# Patient Record
Sex: Male | Born: 1965 | Race: White | Hispanic: No | Marital: Married | Smoking: Never smoker
Health system: Southern US, Community
[De-identification: ages and names within clinical notes are randomized; demographics above are authoritative.]

## PROBLEM LIST (undated history)

## (undated) DIAGNOSIS — E079 Disorder of thyroid, unspecified: Secondary | ICD-10-CM

## (undated) DIAGNOSIS — I10 Essential (primary) hypertension: Secondary | ICD-10-CM

## (undated) DIAGNOSIS — E785 Hyperlipidemia, unspecified: Secondary | ICD-10-CM

## (undated) HISTORY — DX: Hyperlipidemia, unspecified: E78.5

---

## 2014-05-29 ENCOUNTER — Ambulatory Visit
Admit: 2014-05-29 | Disposition: A | Discharge status: Home / Self Care | Payer: Self-pay | Attending: Family Medicine | Admitting: Family Medicine

## 2014-05-29 LAB — DOT URINE DIP
GLUCOSE, UR: NEGATIVE
Protein: NEGATIVE
Specific Gravity: 1.015 (ref 1.000–1.030)

## 2015-05-28 ENCOUNTER — Ambulatory Visit
Admission: EM | Admit: 2015-05-28 | Discharge: 2015-05-28 | Disposition: A | Discharge status: Home / Self Care | Payer: PRIVATE HEALTH INSURANCE | Attending: Family Medicine | Admitting: Family Medicine

## 2015-05-28 ENCOUNTER — Encounter: Payer: Self-pay | Admitting: *Deleted

## 2015-05-28 DIAGNOSIS — Z0289 Encounter for other administrative examinations: Secondary | ICD-10-CM

## 2015-05-28 HISTORY — DX: Essential (primary) hypertension: I10

## 2015-05-28 HISTORY — DX: Disorder of thyroid, unspecified: E07.9

## 2015-05-28 LAB — DEPT OF TRANSP DIPSTICK, URINE (ARMC ONLY)
Glucose, UA: NEGATIVE mg/dL
Protein, ur: NEGATIVE mg/dL
Specific Gravity, Urine: 1.025 (ref 1.005–1.030)

## 2015-05-28 NOTE — ED Provider Notes (Signed)
CSN: [id number]     Arrival date & time 05/28/15  1307 History   None    Chief Complaint  Patient presents with  . Commercial Driver's License Exam   (Consider location/radiation/quality/duration/timing/severity/associated sxs/prior Treatment) HPI Comments: Patient here for DOT Physical (see scanned form)   The history is provided by the patient.    Past Medical History  Diagnosis Date  . Hypertension   . Thyroid disease    History reviewed. No pertinent past surgical history. History reviewed. No pertinent family history. Social History  Substance Use Topics  . Smoking status: Never Smoker   . Smokeless tobacco: Never Used  . Alcohol Use: Yes    Review of Systems  Allergies  Review of patient's allergies indicates no known allergies.  Home Medications   Prior to Admission medications   Medication Sig Start Date End Date Taking? Authorizing Provider  levothyroxine (SYNTHROID, LEVOTHROID) 150 MCG tablet Take 150 mcg by mouth daily before breakfast.   Yes Historical Provider, MD   Meds Ordered and Administered this Visit  Medications - No data to display  BP 141/89 mmHg  Pulse 69  Temp(Src) 98.4 F (36.9 C)  Resp 18  Ht 6\' 7"  (2.007 m)  Wt 234 lb (106.142 kg)  BMI 26.35 kg/m2  SpO2 98% No data found.   Physical Exam  ED Course  Procedures (including critical care time)  Labs Review Labs Reviewed  DEPT OF TRANSP DIPSTICK, URINE([facility] ONLY) - Abnormal; Notable for the following:    Hgb urine dipstick 1+ (*)    All other components within normal limits    Imaging Review No results found.   Visual Acuity Review  Right Eye Distance:   Left Eye Distance:   Bilateral Distance:    Right Eye Near:   Left Eye Near:    Bilateral Near:         MDM   1. Encounter for examination required by Department of Transportation (DOT)    DOT Physical (medically qualified for 1 year; see scanned form)  Payton Mccallumrlando Mauriah Mcmillen, MD 05/28/15 1438

## 2015-05-28 NOTE — ED Notes (Signed)
Patient is here for DOT/CDL physical.

## 2016-05-26 ENCOUNTER — Ambulatory Visit
Admission: EM | Admit: 2016-05-26 | Discharge: 2016-05-26 | Disposition: A | Discharge status: Home / Self Care | Payer: PRIVATE HEALTH INSURANCE | Attending: Family Medicine | Admitting: Family Medicine

## 2016-05-26 ENCOUNTER — Encounter: Payer: Self-pay | Admitting: *Deleted

## 2016-05-26 DIAGNOSIS — Z024 Encounter for examination for driving license: Secondary | ICD-10-CM

## 2016-05-26 LAB — DEPT OF TRANSP DIPSTICK, URINE (ARMC ONLY)
Glucose, UA: NEGATIVE mg/dL
PROTEIN: NEGATIVE mg/dL
Specific Gravity, Urine: <1.005 — ABNORMAL LOW (ref 1.005–1.030)

## 2016-05-26 NOTE — ED Provider Notes (Signed)
[facility]    CSN: [id number] Arrival date & time: 05/26/16  1428     History   Chief Complaint Chief Complaint  Patient presents with  . Commercial Driver's License Exam    HPI Bill Barber is a 51 y.o. male.   Patient's here for DOT patient had a history of hypothyroidism he had a history hypertension but about 3 years ago he lost 40 pounds and was taken off his blood pressure medicine. He states that they've been giving him 1 year DOT's and he requests 2 years this time. Since is no longer on blood pressure medicine will give him a 2 year DOT since it's been 3 years.   The history is provided by the patient. No language interpreter was used.    Past Medical History:  Diagnosis Date  . Hypertension   . Thyroid disease     There are no active problems to display for this patient.   History reviewed. No pertinent surgical history.     Home Medications    Prior to Admission medications   Medication Sig Start Date End Date Taking? Authorizing Provider  levothyroxine (SYNTHROID, LEVOTHROID) 150 MCG tablet Take 150 mcg by mouth daily before breakfast.   Yes Historical Provider, MD    Family History History reviewed. No pertinent family history.  Social History Social History  Substance Use Topics  . Smoking status: Never Smoker  . Smokeless tobacco: Never Used  . Alcohol use Yes     Allergies   Patient has no known allergies.   Review of Systems Review of Systems  All other systems reviewed and are negative.    Physical Exam Triage Vital Signs ED Triage Vitals  Enc Vitals Group     BP 05/26/16 1553 138/88     Pulse Rate 05/26/16 1553 83     Resp 05/26/16 1553 16     Temp 05/26/16 1553 98.5 F (36.9 C)     Temp Source 05/26/16 1553 Oral     SpO2 05/26/16 1553 98 %     Weight 05/26/16 1603 252 lb (114.3 kg)     Height 05/26/16 1603  (1.778 m)     Head Circumference --      Peak Flow --      Pain Score --      Pain  Loc --      Pain Edu? --      Excl. in GC? --    No data found.   Updated Vital Signs BP 138/88 (BP Location: Left Arm)   Pulse 83   Temp 98.5 F (36.9 C) (Oral)   Resp 16   Ht  (1.778 m)   Wt 252 lb (114.3 kg)   SpO2 98%   BMI 36.16 kg/m   Visual Acuity Right Eye Distance:   Left Eye Distance:   Bilateral Distance:    Right Eye Near:   Left Eye Near:    Bilateral Near:     Physical Exam  Constitutional: He is oriented to person, place, and time. He appears well-developed and well-nourished.  HENT:  Head: Normocephalic and atraumatic.  Eyes: EOM are normal. Pupils are equal, round, and reactive to light.  Neck: Normal range of motion. Neck supple.  Cardiovascular: Normal rate.   Pulmonary/Chest: Effort normal.  Abdominal: Soft. Bowel sounds are normal. He exhibits no distension. There is no tenderness. There is no guarding.  Genitourinary: Penis normal.  Musculoskeletal: Normal range of motion. He exhibits no  edema or deformity.  Neurological: He is alert and oriented to person, place, and time.  Skin: Skin is warm.  Psychiatric: He has a normal mood and affect.     UC Treatments / Results  Labs (all labs ordered are listed, but only abnormal results are displayed) Labs Reviewed  DEPT OF TRANSP DIPSTICK, URINE([facility] ONLY) - Abnormal; Notable for the following:       Result Value   Specific Gravity, Urine <1.005 (*)    Hgb urine dipstick SMALL (*)    All other components within normal limits    EKG  EKG Interpretation None       Radiology No results found.  Procedures Procedures (including critical care time)  Medications Ordered in UC Medications - No data to display   Initial Impression / Assessment and Plan / UC Course  I have reviewed the triage vital signs and the nursing notes.  Pertinent labs & imaging results that were available during my care of the patient were reviewed by me and considered in my medical decision  making (see chart for details).   patient's here for DOT given 2 years DOT    Final Clinical Impressions(s) / UC Diagnoses   Final diagnoses:  Encounter for commercial driver medical examination (CDME)    New Prescriptions New Prescriptions   No medications on file     Note: This dictation was prepared with Dragon dictation along with smaller phrase technology. Any transcriptional errors that result from this process are unintentional.   Hassan Rowan, MD 05/26/16 [phone]

## 2016-05-26 NOTE — ED Triage Notes (Signed)
DOT physical. 

## 2018-05-19 ENCOUNTER — Other Ambulatory Visit: Payer: Self-pay

## 2018-05-19 ENCOUNTER — Other Ambulatory Visit: Payer: Self-pay | Admitting: Family Medicine

## 2018-05-19 ENCOUNTER — Ambulatory Visit
Admission: RE | Admit: 2018-05-19 | Discharge: 2018-05-19 | Disposition: A | Discharge status: Home / Self Care | Payer: Managed Care, Other (non HMO) | Attending: Family Medicine | Admitting: Family Medicine

## 2018-05-19 ENCOUNTER — Ambulatory Visit
Admission: RE | Admit: 2018-05-19 | Discharge: 2018-05-19 | Disposition: A | Discharge status: Home / Self Care | Payer: Managed Care, Other (non HMO) | Source: Ambulatory Visit | Attending: Family Medicine | Admitting: Family Medicine

## 2018-05-19 DIAGNOSIS — M25561 Pain in right knee: Secondary | ICD-10-CM

## 2019-04-16 IMAGING — CR RIGHT KNEE - COMPLETE 4+ VIEW
4 series · 4 of 4 positions shown · non-contrast
Comparison: None

CLINICAL DATA: Blunt trauma to RIGHT medial femoral condyle today,
pain, abrasion

EXAM:
RIGHT KNEE - COMPLETE 4+ VIEW

[knee ap]
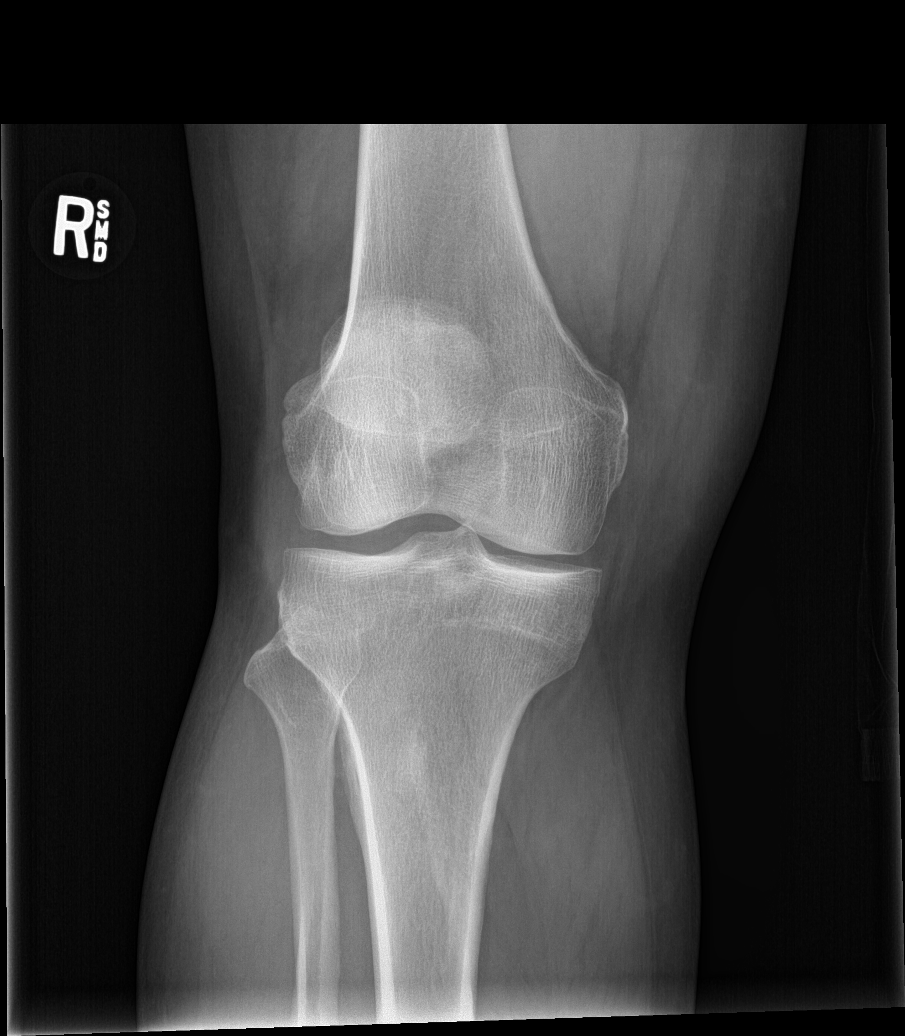

[knee lat]
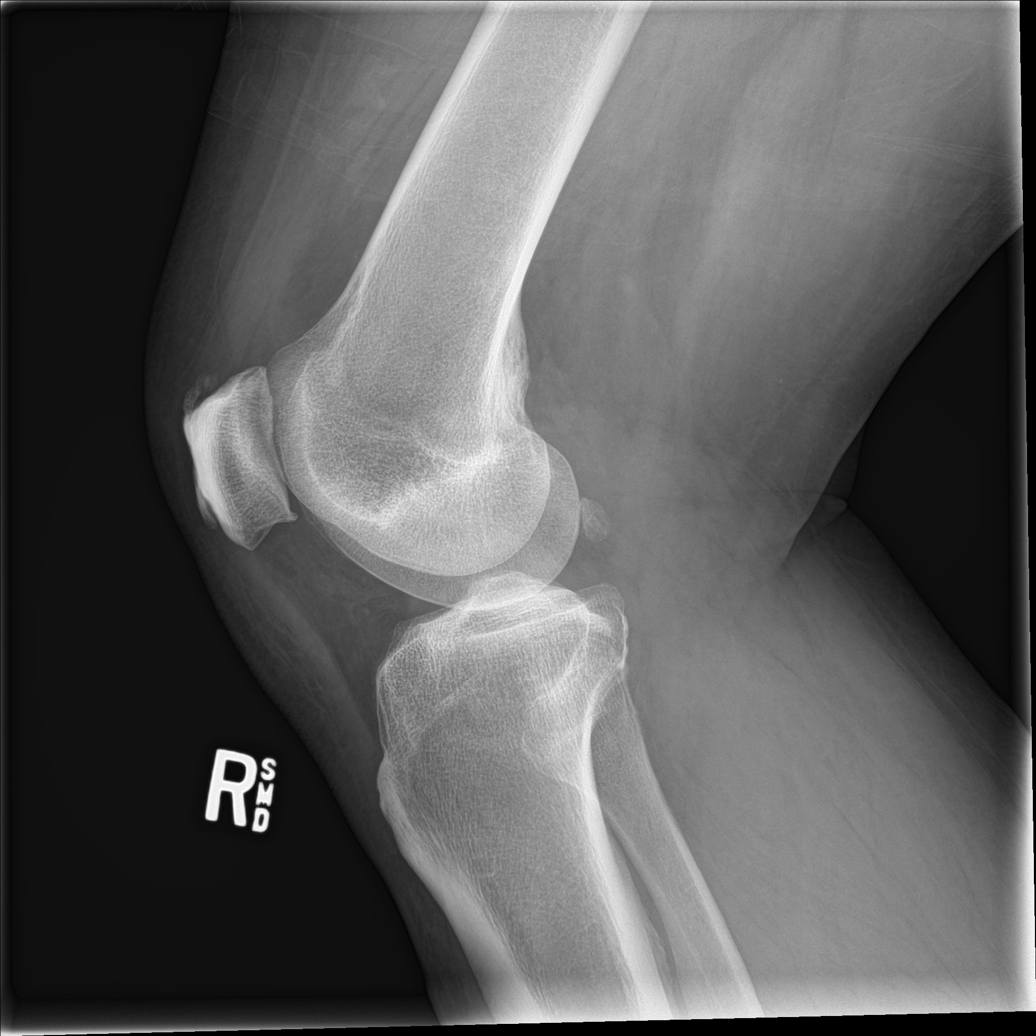

[knee obl (1 of 2)]
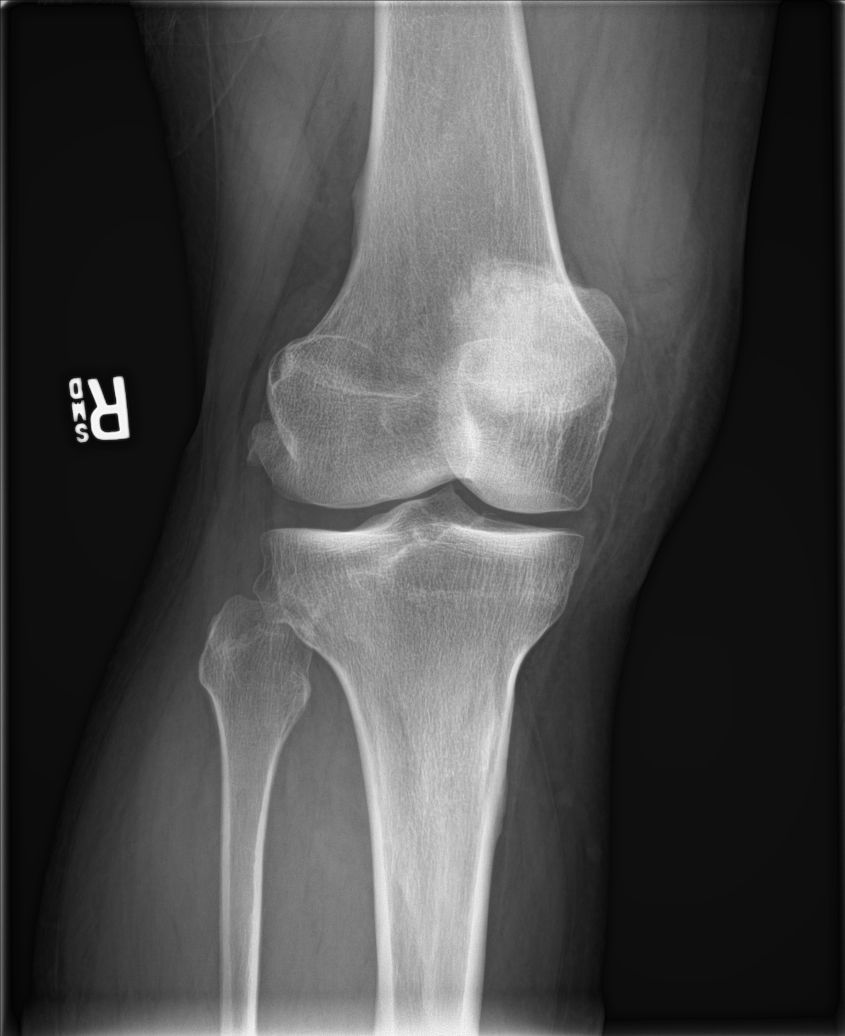

[knee obl (2 of 2)]
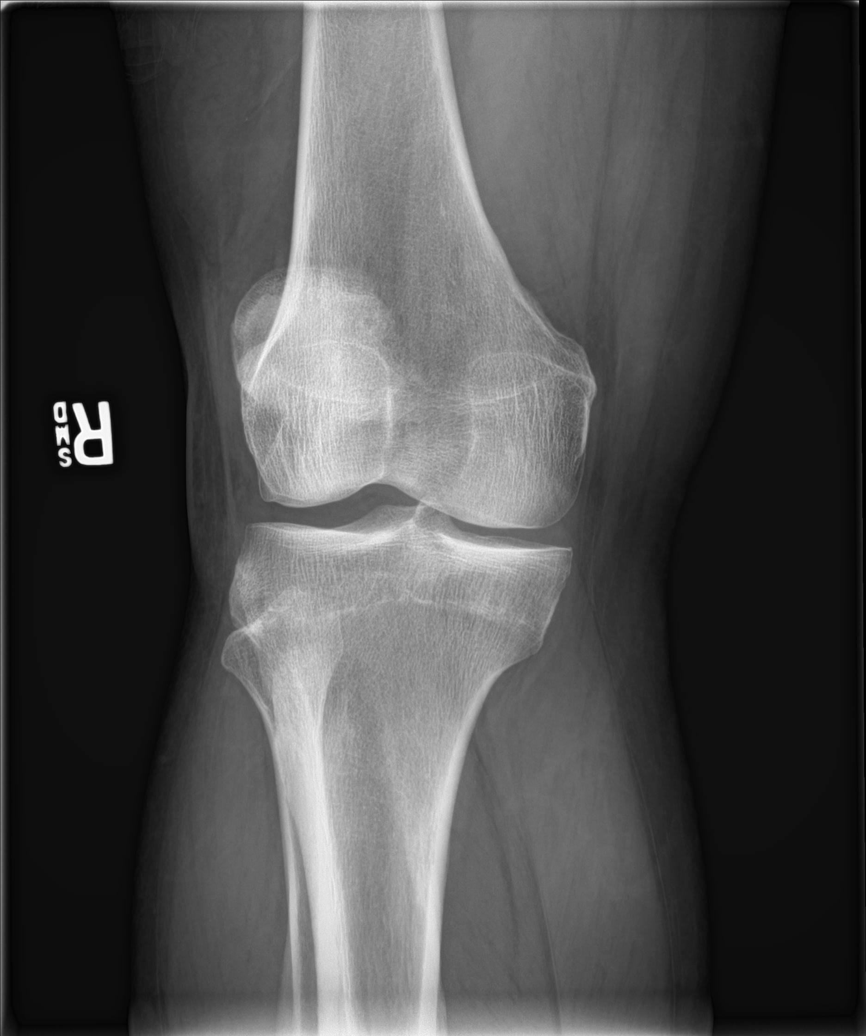

[4 of 4 positions shown; findings below may reference images not displayed]

FINDINGS: Osseous mineralization low normal.

Patellofemoral joint space narrowing and small inferior patellar
marginal spur.

Joint spaces otherwise preserved.

No acute fracture, dislocation, or bone destruction.

Medial soft tissue swelling.

No knee joint effusion.
IMPRESSION: Mild degenerative changes RIGHT patellofemoral joint.

Medial soft tissue swelling RIGHT knee without acute osseous
abnormalities.

## 2022-02-04 ENCOUNTER — Other Ambulatory Visit: Payer: Self-pay | Admitting: Physician Assistant

## 2022-02-04 ENCOUNTER — Ambulatory Visit
Admission: RE | Admit: 2022-02-04 | Discharge: 2022-02-04 | Disposition: A | Discharge status: Home / Self Care | Payer: Worker's Compensation | Source: Ambulatory Visit | Attending: Physician Assistant | Admitting: Physician Assistant

## 2022-02-04 DIAGNOSIS — S8991XA Unspecified injury of right lower leg, initial encounter: Secondary | ICD-10-CM

## 2022-08-05 ENCOUNTER — Encounter: Payer: Self-pay | Admitting: Cardiovascular Disease

## 2022-08-05 ENCOUNTER — Ambulatory Visit (INDEPENDENT_AMBULATORY_CARE_PROVIDER_SITE_OTHER): Payer: No Typology Code available for payment source | Admitting: Cardiovascular Disease

## 2022-08-05 VITALS — BP 146/80 | HR 85 | Ht 69.0 in | Wt 248.0 lb

## 2022-08-05 DIAGNOSIS — E782 Mixed hyperlipidemia: Secondary | ICD-10-CM

## 2022-08-05 DIAGNOSIS — I10 Essential (primary) hypertension: Secondary | ICD-10-CM | POA: Diagnosis not present

## 2022-08-05 DIAGNOSIS — Z6836 Body mass index (BMI) 36.0-36.9, adult: Secondary | ICD-10-CM | POA: Insufficient documentation

## 2022-08-05 DIAGNOSIS — I499 Cardiac arrhythmia, unspecified: Secondary | ICD-10-CM

## 2022-08-05 DIAGNOSIS — E039 Hypothyroidism, unspecified: Secondary | ICD-10-CM | POA: Insufficient documentation

## 2022-08-05 NOTE — Assessment & Plan Note (Signed)
Better on recheck. Patient states he is working on diet and exercise to lose weight. DASH handout given.

## 2022-08-05 NOTE — Assessment & Plan Note (Signed)
Patient reports episodes of atrial fibrillation per smart watch. No strips available. Will order a holter monitor to check for arrhythmias. Echo to check heart function. Stress test to check for blockages.

## 2022-08-05 NOTE — Assessment & Plan Note (Signed)
LDL 12/2021 68, continue atorvastatin 40 mg daily.

## 2022-08-05 NOTE — Assessment & Plan Note (Signed)
The patient is asked to make an attempt to improve diet and exercise patterns to aid in medical management of this problem.  

## 2022-08-05 NOTE — Progress Notes (Signed)
Cardiology Office Note   Date:  08/05/2022   ID:  Bill Barber, DOB 13-Oct-1965, MRN [id number]  PCP:  [name], MD  Cardiologist:  [name], MD      History of Present Illness: Bill Barber is a 57 y.o. male who presents for  Chief Complaint  Patient presents with   New Patient (Initial Visit)    Consult    Patient in office to establish care. Reports 4 episodes of possible atrial fibrillation over the past year, noted on apple watch. Denies chest pain, shortness of breath, dizziness.     Past Medical History:  Diagnosis Date   Hyperlipidemia    Hypertension    Thyroid disease      History reviewed. No pertinent surgical history.   Current Outpatient Medications  Medication Sig Dispense Refill   amLODipine (NORVASC) 10 MG tablet Take 10 mg by mouth daily.     atorvastatin (LIPITOR) 40 MG tablet Take 40 mg by mouth daily.     Cholecalciferol (D 1000) 25 MCG (1000 UT) capsule Take 1,000 Units by mouth daily.     hydrochlorothiazide (HYDRODIURIL) 25 MG tablet Take 25 mg by mouth daily.     levothyroxine (SYNTHROID, LEVOTHROID) 150 MCG tablet Take 150 mcg by mouth daily before breakfast.     lisinopril (ZESTRIL) 40 MG tablet Take 40 mg by mouth daily.     sildenafil (VIAGRA) 100 MG tablet Take 100 mg by mouth daily as needed.     Turmeric (QC TUMERIC COMPLEX) 500 MG CAPS Take by mouth.     Zinc Picolinate POWD Take by mouth.     No current facility-administered medications for this visit.    Allergies:   Patient has no known allergies.    Social History:   reports that he has never smoked. He has never used smokeless tobacco. He reports current alcohol use. He reports that he does not use drugs.   Family History:  family history is not on file.    ROS:     Review of Systems  Constitutional: Negative.   HENT: Negative.    Eyes: Negative.   Respiratory: Negative.    Cardiovascular: Negative.   Gastrointestinal: Negative.    Genitourinary: Negative.   Musculoskeletal: Negative.   Skin: Negative.   Neurological: Negative.   Endo/Heme/Allergies: Negative.   Psychiatric/Behavioral: Negative.    All other systems reviewed and are negative.    All other systems are reviewed and negative.    PHYSICAL EXAM: VS:  BP (!) 146/80 (BP Location: Left Arm, Patient Position: Sitting, Cuff Size: Large)   Pulse 85   Ht 5\' 9"  (1.753 m)   Wt 248 lb (112.5 kg)   SpO2 98%   BMI 36.62 kg/m  , BMI Body mass index is 36.62 kg/m. Last weight:  Wt Readings from Last 3 Encounters:  08/05/22 248 lb (112.5 kg)  05/26/16 252 lb (114.3 kg)  05/28/15 234 lb (106.1 kg)     Physical Exam Vitals reviewed.  Constitutional:      Appearance: Normal appearance. He is normal weight.  HENT:     Head: Normocephalic.     Nose: Nose normal.     Mouth/Throat:     Mouth: Mucous membranes are moist.  Eyes:     Pupils: Pupils are equal, round, and reactive to light.  Cardiovascular:     Rate and Rhythm: Normal rate and regular rhythm.     Pulses: Normal pulses.     Heart  sounds: Normal heart sounds.  Pulmonary:     Effort: Pulmonary effort is normal.  Abdominal:     General: Abdomen is flat. Bowel sounds are normal.  Musculoskeletal:        General: Normal range of motion.     Cervical back: Normal range of motion.  Skin:    General: Skin is warm.  Neurological:     General: No focal deficit present.     Mental Status: He is alert.  Psychiatric:        Mood and Affect: Mood normal.     EKG: normal sinus rhythm, HR 87 bpm  Recent Labs: No results found for requested labs within last 365 days.    Lipid Panel No results found for: "CHOL", "TRIG", "HDL", "CHOLHDL", "VLDL", "LDLCALC", "LDLDIRECT"    ASSESSMENT AND PLAN:    ICD-10-CM   1. Cardiac arrhythmia, unspecified cardiac arrhythmia type  I49.9 PCV ECHOCARDIOGRAM COMPLETE    MYOCARDIAL PERFUSION IMAGING    Holter monitor - 72 hour    2. Mixed  hyperlipidemia  E78.2     3. Primary hypertension  I10 PCV ECHOCARDIOGRAM COMPLETE    MYOCARDIAL PERFUSION IMAGING    4. BMI 36.0-36.9,adult  Z68.36        Problem List Items Addressed This Visit       Cardiovascular and Mediastinum   Primary hypertension    Better on recheck. Patient states he is working on diet and exercise to lose weight. DASH handout given.       Relevant Medications   amLODipine (NORVASC) 10 MG tablet   atorvastatin (LIPITOR) 40 MG tablet   hydrochlorothiazide (HYDRODIURIL) 25 MG tablet   lisinopril (ZESTRIL) 40 MG tablet   sildenafil (VIAGRA) 100 MG tablet   Other Relevant Orders   PCV ECHOCARDIOGRAM COMPLETE   MYOCARDIAL PERFUSION IMAGING   Cardiac arrhythmia - Primary    Patient reports episodes of atrial fibrillation per smart watch. No strips available. Will order a holter monitor to check for arrhythmias. Echo to check heart function. Stress test to check for blockages.       Relevant Medications   amLODipine (NORVASC) 10 MG tablet   atorvastatin (LIPITOR) 40 MG tablet   hydrochlorothiazide (HYDRODIURIL) 25 MG tablet   lisinopril (ZESTRIL) 40 MG tablet   sildenafil (VIAGRA) 100 MG tablet   Other Relevant Orders   PCV ECHOCARDIOGRAM COMPLETE   MYOCARDIAL PERFUSION IMAGING   Holter monitor - 72 hour     Other   Mixed hyperlipidemia    LDL 12/2021 68, continue atorvastatin 40 mg daily.       Relevant Medications   amLODipine (NORVASC) 10 MG tablet   atorvastatin (LIPITOR) 40 MG tablet   hydrochlorothiazide (HYDRODIURIL) 25 MG tablet   lisinopril (ZESTRIL) 40 MG tablet   sildenafil (VIAGRA) 100 MG tablet   BMI 36.0-36.9,adult    The patient is asked to make an attempt to improve diet and exercise patterns to aid in medical management of this problem.         Disposition:   Return in about 6 weeks (around 09/16/2022) for after 4 week holter.    Total time spent: 30 minutes  Signed,  [name], MD  08/05/2022 11:35 AM     [facility]

## 2022-08-09 ENCOUNTER — Encounter: Payer: Self-pay | Admitting: Cardiovascular Disease

## 2022-08-18 ENCOUNTER — Ambulatory Visit (INDEPENDENT_AMBULATORY_CARE_PROVIDER_SITE_OTHER): Payer: No Typology Code available for payment source

## 2022-08-18 DIAGNOSIS — I499 Cardiac arrhythmia, unspecified: Secondary | ICD-10-CM | POA: Diagnosis not present

## 2022-08-18 DIAGNOSIS — I10 Essential (primary) hypertension: Secondary | ICD-10-CM

## 2022-08-18 MED ORDER — TECHNETIUM TC 99M SESTAMIBI GENERIC - CARDIOLITE
32.9000 | Freq: Once | INTRAVENOUS | Status: AC | PRN
Start: 2022-08-18 — End: 2022-08-18
  Administered 2022-08-18: 32.9 via INTRAVENOUS

## 2022-08-18 MED ORDER — TECHNETIUM TC 99M SESTAMIBI GENERIC - CARDIOLITE
10.3000 | Freq: Once | INTRAVENOUS | Status: AC | PRN
  Administered 2022-08-18: 10.3 via INTRAVENOUS

## 2022-08-20 ENCOUNTER — Ambulatory Visit (INDEPENDENT_AMBULATORY_CARE_PROVIDER_SITE_OTHER): Payer: No Typology Code available for payment source

## 2022-08-20 ENCOUNTER — Ambulatory Visit: Payer: No Typology Code available for payment source

## 2022-08-20 DIAGNOSIS — I499 Cardiac arrhythmia, unspecified: Secondary | ICD-10-CM | POA: Diagnosis not present

## 2022-08-20 DIAGNOSIS — I340 Nonrheumatic mitral (valve) insufficiency: Secondary | ICD-10-CM | POA: Diagnosis not present

## 2022-08-20 DIAGNOSIS — I10 Essential (primary) hypertension: Secondary | ICD-10-CM

## 2022-08-24 ENCOUNTER — Encounter: Payer: Self-pay | Admitting: Cardiovascular Disease

## 2022-08-24 ENCOUNTER — Ambulatory Visit (INDEPENDENT_AMBULATORY_CARE_PROVIDER_SITE_OTHER): Payer: No Typology Code available for payment source | Admitting: Cardiovascular Disease

## 2022-08-24 ENCOUNTER — Ambulatory Visit: Payer: No Typology Code available for payment source | Admitting: Cardiovascular Disease

## 2022-08-24 VITALS — BP 186/96 | HR 85 | Ht 69.0 in | Wt 250.8 lb

## 2022-08-24 DIAGNOSIS — I10 Essential (primary) hypertension: Secondary | ICD-10-CM

## 2022-08-24 DIAGNOSIS — R0602 Shortness of breath: Secondary | ICD-10-CM

## 2022-08-24 DIAGNOSIS — I499 Cardiac arrhythmia, unspecified: Secondary | ICD-10-CM | POA: Diagnosis not present

## 2022-08-24 DIAGNOSIS — I340 Nonrheumatic mitral (valve) insufficiency: Secondary | ICD-10-CM

## 2022-08-24 DIAGNOSIS — E782 Mixed hyperlipidemia: Secondary | ICD-10-CM | POA: Diagnosis not present

## 2022-08-24 MED ORDER — SPIRONOLACTONE 25 MG PO TABS
25.0000 mg | ORAL_TABLET | Freq: Every day | ORAL | 11 refills | Status: AC
Start: 2022-08-24 — End: 2023-08-24

## 2022-08-24 NOTE — Progress Notes (Signed)
Cardiology Office Note   Date:  08/24/2022   ID:  Bill Barber, DOB [birth year], MRN [id number]  PCP:  [name], MD  Cardiologist:  [name], MD      History of Present Illness: Bill Barber is a 57 y.o. male who presents for  Chief Complaint  Patient presents with   Follow-up    NST & Echo results    Still has SOB, less palpitation, BP is up      Past Medical History:  Diagnosis Date   Hyperlipidemia    Hypertension    Thyroid disease      History reviewed. No pertinent surgical history.   Current Outpatient Medications  Medication Sig Dispense Refill   amLODipine (NORVASC) 10 MG tablet Take 10 mg by mouth daily.     atorvastatin (LIPITOR) 40 MG tablet Take 40 mg by mouth daily.     Cholecalciferol (D 1000) 25 MCG (1000 UT) capsule Take 1,000 Units by mouth daily.     hydrochlorothiazide (HYDRODIURIL) 25 MG tablet Take 25 mg by mouth daily.     levothyroxine (SYNTHROID, LEVOTHROID) 150 MCG tablet Take 150 mcg by mouth daily before breakfast.     lisinopril (ZESTRIL) 40 MG tablet Take 40 mg by mouth daily.     sildenafil (VIAGRA) 100 MG tablet Take 100 mg by mouth daily as needed.     spironolactone (ALDACTONE) 25 MG tablet Take 1 tablet (25 mg total) by mouth daily. 30 tablet 11   Turmeric (QC TUMERIC COMPLEX) 500 MG CAPS Take by mouth.     Zinc Picolinate POWD Take by mouth.     No current facility-administered medications for this visit.    Allergies:   Patient has no known allergies.    Social History:   reports that he has never smoked. He has never used smokeless tobacco. He reports current alcohol use. He reports that he does not use drugs.   Family History:  family history is not on file.    ROS:     Review of Systems  Constitutional: Negative.   HENT: Negative.    Eyes: Negative.   Respiratory: Negative.    Gastrointestinal: Negative.   Genitourinary: Negative.   Musculoskeletal: Negative.   Skin: Negative.    Neurological: Negative.   Endo/Heme/Allergies: Negative.   Psychiatric/Behavioral: Negative.    All other systems reviewed and are negative.     All other systems are reviewed and negative.    PHYSICAL EXAM: VS:  BP (!) 186/96   Pulse 85   Ht 5\' 9"  (1.753 m)   Wt 250 lb 12.8 oz (113.8 kg)   SpO2 100%   BMI 37.04 kg/m  , BMI Body mass index is 37.04 kg/m. Last weight:  Wt Readings from Last 3 Encounters:  08/24/22 250 lb 12.8 oz (113.8 kg)  08/05/22 248 lb (112.5 kg)  05/26/16 252 lb (114.3 kg)     Physical Exam Vitals reviewed.  Constitutional:      Appearance: Normal appearance. He is normal weight.  HENT:     Head: Normocephalic.     Nose: Nose normal.     Mouth/Throat:     Mouth: Mucous membranes are moist.  Eyes:     Pupils: Pupils are equal, round, and reactive to light.  Cardiovascular:     Rate and Rhythm: Normal rate and regular rhythm.     Pulses: Normal pulses.     Heart sounds: Normal heart sounds.  Pulmonary:  Effort: Pulmonary effort is normal.  Abdominal:     General: Abdomen is flat. Bowel sounds are normal.  Musculoskeletal:        General: Normal range of motion.     Cervical back: Normal range of motion.  Skin:    General: Skin is warm.  Neurological:     General: No focal deficit present.     Mental Status: He is alert.  Psychiatric:        Mood and Affect: Mood normal.       EKG:   Recent Labs: No results found for requested labs within last 365 days.    Lipid Panel No results found for: "CHOL", "TRIG", "HDL", "CHOLHDL", "VLDL", "LDLCALC", "LDLDIRECT"    Other studies Reviewed: Additional studies/ records that were reviewed today include:  Review of the above records demonstrates:       No data to display            ASSESSMENT AND PLAN:    ICD-10-CM   1. Cardiac arrhythmia, unspecified cardiac arrhythmia type  I49.9 spironolactone (ALDACTONE) 25 MG tablet    Basic metabolic panel   Still wearing holter     2. Primary hypertension  I10 spironolactone (ALDACTONE) 25 MG tablet    Basic metabolic panel   add aldactone 25 as BP 180/90, may need hydralazine also but on f/u, check labs 1-2 week    3. Mixed hyperlipidemia  E78.2 spironolactone (ALDACTONE) 25 MG tablet    Basic metabolic panel    4. SOB (shortness of breath)  R06.02 spironolactone (ALDACTONE) 25 MG tablet    Basic metabolic panel   stress test negative    5. Nonrheumatic mitral valve regurgitation  I34.0 spironolactone (ALDACTONE) 25 MG tablet    Basic metabolic panel       Problem List Items Addressed This Visit       Cardiovascular and Mediastinum   Primary hypertension   Relevant Medications   spironolactone (ALDACTONE) 25 MG tablet   Other Relevant Orders   Basic metabolic panel   Cardiac arrhythmia - Primary   Relevant Medications   spironolactone (ALDACTONE) 25 MG tablet   Other Relevant Orders   Basic metabolic panel     Other   Mixed hyperlipidemia   Relevant Medications   spironolactone (ALDACTONE) 25 MG tablet   Other Relevant Orders   Basic metabolic panel   Other Visit Diagnoses     SOB (shortness of breath)       stress test negative   Relevant Medications   spironolactone (ALDACTONE) 25 MG tablet   Other Relevant Orders   Basic metabolic panel   Nonrheumatic mitral valve regurgitation       Relevant Medications   spironolactone (ALDACTONE) 25 MG tablet   Other Relevant Orders   Basic metabolic panel          Disposition:   Return in about 2 weeks (around 09/07/2022).    Total time spent: 30 minutes  Signed,  [name], MD  08/24/2022 1:53 PM    [facility]

## 2022-09-03 DIAGNOSIS — I499 Cardiac arrhythmia, unspecified: Secondary | ICD-10-CM | POA: Diagnosis not present

## 2022-09-14 ENCOUNTER — Other Ambulatory Visit: Payer: No Typology Code available for payment source

## 2022-09-15 LAB — BASIC METABOLIC PANEL
BUN/Creatinine Ratio: 18 (ref 9–20)
BUN: 17 mg/dL (ref 6–24)
CO2: 23 mmol/L (ref 20–29)
Calcium: 9.7 mg/dL (ref 8.7–10.2)
Chloride: 94 mmol/L — ABNORMAL LOW (ref 96–106)
Creatinine, Ser: 0.97 mg/dL (ref 0.76–1.27)
Glucose: 92 mg/dL (ref 70–99)
Potassium: 4.8 mmol/L (ref 3.5–5.2)
Sodium: 133 mmol/L — ABNORMAL LOW (ref 134–144)
eGFR: 90 mL/min/{1.73_m2} (ref >59)

## 2022-09-16 ENCOUNTER — Ambulatory Visit (INDEPENDENT_AMBULATORY_CARE_PROVIDER_SITE_OTHER): Payer: No Typology Code available for payment source | Admitting: Cardiology

## 2022-09-16 ENCOUNTER — Encounter: Payer: Self-pay | Admitting: Cardiology

## 2022-09-16 VITALS — BP 146/78 | HR 75 | Ht 69.0 in | Wt 240.0 lb

## 2022-09-16 DIAGNOSIS — I499 Cardiac arrhythmia, unspecified: Secondary | ICD-10-CM | POA: Diagnosis not present

## 2022-09-16 DIAGNOSIS — I10 Essential (primary) hypertension: Secondary | ICD-10-CM | POA: Diagnosis not present

## 2022-09-16 DIAGNOSIS — I480 Paroxysmal atrial fibrillation: Secondary | ICD-10-CM | POA: Diagnosis not present

## 2022-09-16 DIAGNOSIS — E782 Mixed hyperlipidemia: Secondary | ICD-10-CM | POA: Diagnosis not present

## 2022-09-16 MED ORDER — APIXABAN 5 MG PO TABS: 5.0000 mg | ORAL_TABLET | Freq: Two times a day (BID) | ORAL | 3 refills | Status: DC

## 2022-09-16 NOTE — Assessment & Plan Note (Signed)
Holter monitor showed atrial fibrillation. Will start patient on Eliquis 5 mg daily.

## 2022-09-16 NOTE — Progress Notes (Signed)
Cardiology Office Note   Date:  09/16/2022   ID:  Bill Barber, DOB 11/13/1965, MRN [id number]  PCP:  [name], MD  Cardiologist:  Marisue Ivan, NP      History of Present Illness: Bill Barber is a 57 y.o. male who presents for  Chief Complaint  Patient presents with   Follow-up    Patient in office for 2 week follow up. B/p at home 135/80. Patient doing better. Dieting and exercising, losing weight. Taking and tolerating spironolactone. Holter monitor results not available at previous visit.       Past Medical History:  Diagnosis Date   Hyperlipidemia    Hypertension    Thyroid disease      History reviewed. No pertinent surgical history.   Current Outpatient Medications  Medication Sig Dispense Refill   amLODipine (NORVASC) 10 MG tablet Take 10 mg by mouth daily.     apixaban (ELIQUIS) 5 MG TABS tablet Take 1 tablet (5 mg total) by mouth 2 (two) times daily. 60 tablet 3   atorvastatin (LIPITOR) 40 MG tablet Take 40 mg by mouth daily.     Cholecalciferol (D 1000) 25 MCG (1000 UT) capsule Take 1,000 Units by mouth daily.     hydrochlorothiazide (HYDRODIURIL) 25 MG tablet Take 25 mg by mouth daily.     levothyroxine (SYNTHROID, LEVOTHROID) 150 MCG tablet Take 150 mcg by mouth daily before breakfast.     lisinopril (ZESTRIL) 40 MG tablet Take 40 mg by mouth daily.     sildenafil (VIAGRA) 100 MG tablet Take 100 mg by mouth daily as needed.     spironolactone (ALDACTONE) 25 MG tablet Take 1 tablet (25 mg total) by mouth daily. 30 tablet 11   Turmeric (QC TUMERIC COMPLEX) 500 MG CAPS Take by mouth.     Zinc Picolinate POWD Take by mouth.     No current facility-administered medications for this visit.    Allergies:   Patient has no known allergies.    Social History:   reports that he has never smoked. He has never used smokeless tobacco. He reports current alcohol use. He reports that he does not use drugs.   Family History:  family history  is not on file.    ROS:     Review of Systems  Constitutional: Negative.   HENT: Negative.    Eyes: Negative.   Respiratory: Negative.    Cardiovascular: Negative.   Gastrointestinal: Negative.   Genitourinary: Negative.   Musculoskeletal: Negative.   Skin: Negative.   Neurological: Negative.   Endo/Heme/Allergies: Negative.   Psychiatric/Behavioral: Negative.    All other systems reviewed and are negative.    All other systems are reviewed and negative.    PHYSICAL EXAM: VS:  BP (!) 146/78   Pulse 75   Ht 5\' 9"  (1.753 m)   Wt 240 lb (108.9 kg)   SpO2 97%   BMI 35.44 kg/m  , BMI Body mass index is 35.44 kg/m. Last weight:  Wt Readings from Last 3 Encounters:  09/16/22 240 lb (108.9 kg)  08/24/22 250 lb 12.8 oz (113.8 kg)  08/05/22 248 lb (112.5 kg)     Physical Exam Vitals and nursing note reviewed.  Constitutional:      Appearance: Normal appearance. He is normal weight.  HENT:     Head: Normocephalic and atraumatic.     Nose: Nose normal.     Mouth/Throat:     Mouth: Mucous membranes are moist.  Pharynx: Oropharynx is clear.  Eyes:     Extraocular Movements: Extraocular movements intact.     Conjunctiva/sclera: Conjunctivae normal.     Pupils: Pupils are equal, round, and reactive to light.  Cardiovascular:     Rate and Rhythm: Normal rate and regular rhythm.     Pulses: Normal pulses.     Heart sounds: Normal heart sounds.  Pulmonary:     Effort: Pulmonary effort is normal.     Breath sounds: Normal breath sounds.  Abdominal:     General: Abdomen is flat. Bowel sounds are normal.     Palpations: Abdomen is soft.  Musculoskeletal:        General: Normal range of motion.     Cervical back: Normal range of motion.  Skin:    General: Skin is warm and dry.  Neurological:     General: No focal deficit present.     Mental Status: He is alert and oriented to person, place, and time. Mental status is at baseline.  Psychiatric:        Mood and  Affect: Mood normal.        Behavior: Behavior normal.        Thought Content: Thought content normal.        Judgment: Judgment normal.     EKG: none today  Recent Labs: 09/14/2022: BUN 17; Creatinine, Ser 0.97; Potassium 4.8; Sodium 133    Lipid Panel No results found for: "CHOL", "TRIG", "HDL", "CHOLHDL", "VLDL", "LDLCALC", "LDLDIRECT"    ASSESSMENT AND PLAN:    ICD-10-CM   1. Paroxysmal atrial fibrillation (HCC)  I48.0     2. Primary hypertension  I10     3. Cardiac arrhythmia, unspecified cardiac arrhythmia type  I49.9     4. Mixed hyperlipidemia  E78.2        Problem List Items Addressed This Visit       Cardiovascular and Mediastinum   Primary hypertension   Relevant Medications   apixaban (ELIQUIS) 5 MG TABS tablet   Cardiac arrhythmia - Primary    Holter monitor showed atrial fibrillation. Will start patient on Eliquis 5 mg daily.       Relevant Medications   apixaban (ELIQUIS) 5 MG TABS tablet     Other   Mixed hyperlipidemia   Relevant Medications   apixaban (ELIQUIS) 5 MG TABS tablet     Disposition:   Return in about 2 weeks (around 09/30/2022).    Total time spent: 30 minutes  Signed,  Google, NP  09/16/2022 11:15 AM    [facility]

## 2022-10-07 ENCOUNTER — Encounter: Payer: Self-pay | Admitting: Cardiovascular Disease

## 2022-10-07 ENCOUNTER — Ambulatory Visit (INDEPENDENT_AMBULATORY_CARE_PROVIDER_SITE_OTHER): Payer: No Typology Code available for payment source | Admitting: Cardiovascular Disease

## 2022-10-07 VITALS — BP 130/80 | HR 83 | Ht 69.0 in | Wt 237.2 lb

## 2022-10-07 DIAGNOSIS — Z8679 Personal history of other diseases of the circulatory system: Secondary | ICD-10-CM

## 2022-10-07 DIAGNOSIS — I340 Nonrheumatic mitral (valve) insufficiency: Secondary | ICD-10-CM

## 2022-10-07 DIAGNOSIS — R0602 Shortness of breath: Secondary | ICD-10-CM | POA: Diagnosis not present

## 2022-10-07 DIAGNOSIS — E782 Mixed hyperlipidemia: Secondary | ICD-10-CM

## 2022-10-07 DIAGNOSIS — I10 Essential (primary) hypertension: Secondary | ICD-10-CM

## 2022-10-07 MED ORDER — AMLODIPINE BESYLATE 10 MG PO TABS: 10.0000 mg | ORAL_TABLET | Freq: Every day | ORAL | 2 refills | Status: DC

## 2022-10-07 MED ORDER — SOTALOL HCL 80 MG PO TABS
80.0000 mg | ORAL_TABLET | Freq: Two times a day (BID) | ORAL | 11 refills | Status: AC
Start: 2022-10-07 — End: 2023-10-07

## 2022-10-07 NOTE — Progress Notes (Signed)
Cardiology Office Note   Date:  10/07/2022   ID:  Antonios Tintle, DOB 08/28/1965, MRN [id number]  PCP:  [name], MD  Cardiologist:  [name], MD      History of Present Illness: Bill Barber is a 57 y.o. male who presents for  Chief Complaint  Patient presents with   Follow-up    2 week f/u    Patient denies any chest pain or shortness of breath and came for follow-up of Holter monitor after being diagnosed of atrial fibrillation on Apple Watch.      Past Medical History:  Diagnosis Date   Hyperlipidemia    Hypertension    Thyroid disease      History reviewed. No pertinent surgical history.   Current Outpatient Medications  Medication Sig Dispense Refill   sotalol (BETAPACE) 80 MG tablet Take 1 tablet (80 mg total) by mouth 2 (two) times daily. 60 tablet 11   amLODipine (NORVASC) 10 MG tablet Take 1 tablet (10 mg total) by mouth daily. 30 tablet 2   apixaban (ELIQUIS) 5 MG TABS tablet Take 1 tablet (5 mg total) by mouth 2 (two) times daily. 60 tablet 3   atorvastatin (LIPITOR) 40 MG tablet Take 40 mg by mouth daily.     Cholecalciferol (D 1000) 25 MCG (1000 UT) capsule Take 1,000 Units by mouth daily.     levothyroxine (SYNTHROID, LEVOTHROID) 150 MCG tablet Take 150 mcg by mouth daily before breakfast.     lisinopril (ZESTRIL) 40 MG tablet Take 40 mg by mouth daily.     sildenafil (VIAGRA) 100 MG tablet Take 100 mg by mouth daily as needed.     spironolactone (ALDACTONE) 25 MG tablet Take 1 tablet (25 mg total) by mouth daily. 30 tablet 11   Turmeric (QC TUMERIC COMPLEX) 500 MG CAPS Take by mouth.     Zinc Picolinate POWD Take by mouth.     No current facility-administered medications for this visit.    Allergies:   Patient has no known allergies.    Social History:   reports that he has never smoked. He has never used smokeless tobacco. He reports current alcohol use. He reports that he does not use drugs.   Family History:  family  history is not on file.    ROS:     ROS    All other systems are reviewed and negative.    PHYSICAL EXAM: VS:  BP 130/80   Pulse 83   Ht 5\' 9"  (1.753 m)   Wt 237 lb 3.2 oz (107.6 kg)   SpO2 96%   BMI 35.03 kg/m  , BMI Body mass index is 35.03 kg/m. Last weight:  Wt Readings from Last 3 Encounters:  10/07/22 237 lb 3.2 oz (107.6 kg)  09/16/22 240 lb (108.9 kg)  08/24/22 250 lb 12.8 oz (113.8 kg)     Physical Exam    EKG:   Recent Labs: 09/14/2022: BUN 17; Creatinine, Ser 0.97; Potassium 4.8; Sodium 133    Lipid Panel No results found for: "CHOL", "TRIG", "HDL", "CHOLHDL", "VLDL", "LDLCALC", "LDLDIRECT"    Other studies Reviewed: Additional studies/ records that were reviewed today include:  Review of the above records demonstrates:       No data to display            ASSESSMENT AND PLAN:    ICD-10-CM   1. Nonrheumatic mitral valve regurgitation  I34.0 sotalol (BETAPACE) 80 MG tablet    2. Atrial  fibrillation, currently in sinus rhythm  Z86.79 sotalol (BETAPACE) 80 MG tablet   Holter monitor confirmed patient has repeated episodes of atrial fibrillation.  Heart rate was as high as 170.  Will start the patient on sotalol 80 once a day.    3. SOB (shortness of breath)  R06.02 sotalol (BETAPACE) 80 MG tablet    4. Mixed hyperlipidemia  E78.2 sotalol (BETAPACE) 80 MG tablet    5. Primary hypertension  I10 sotalol (BETAPACE) 80 MG tablet   Will stop hydrochlorothiazide because it can lead to hypokalemia and hypomagnesemia in the setting of starting the patient on sotalol.       Problem List Items Addressed This Visit       Cardiovascular and Mediastinum   Primary hypertension   Relevant Medications   sotalol (BETAPACE) 80 MG tablet   amLODipine (NORVASC) 10 MG tablet     Other   Mixed hyperlipidemia   Relevant Medications   sotalol (BETAPACE) 80 MG tablet   amLODipine (NORVASC) 10 MG tablet   Other Visit Diagnoses     Nonrheumatic mitral  valve regurgitation    -  Primary   Relevant Medications   sotalol (BETAPACE) 80 MG tablet   amLODipine (NORVASC) 10 MG tablet   Atrial fibrillation, currently in sinus rhythm       Holter monitor confirmed patient has repeated episodes of atrial fibrillation.  Heart rate was as high as 170.  Will start the patient on sotalol 80 once a day.   Relevant Medications   sotalol (BETAPACE) 80 MG tablet   SOB (shortness of breath)       Relevant Medications   sotalol (BETAPACE) 80 MG tablet          Disposition:   Return in about 1 week (around 10/14/2022).    Total time spent: 40 minutes  Signed,  [name], MD  10/07/2022 10:28 AM    [facility]

## 2022-10-07 NOTE — Patient Instructions (Signed)
Will start the patient on sotalol 80 mg once a day and stop hydrochlorothiazide and continue rest of the medication.

## 2022-10-14 ENCOUNTER — Encounter: Payer: Self-pay | Admitting: Cardiovascular Disease

## 2022-10-14 ENCOUNTER — Ambulatory Visit: Payer: No Typology Code available for payment source | Admitting: Cardiovascular Disease

## 2022-10-14 VITALS — BP 139/72 | HR 55 | Ht 69.0 in | Wt 240.2 lb

## 2022-10-14 DIAGNOSIS — I340 Nonrheumatic mitral (valve) insufficiency: Secondary | ICD-10-CM

## 2022-10-14 DIAGNOSIS — I10 Essential (primary) hypertension: Secondary | ICD-10-CM

## 2022-10-14 DIAGNOSIS — Z8679 Personal history of other diseases of the circulatory system: Secondary | ICD-10-CM | POA: Diagnosis not present

## 2022-10-14 DIAGNOSIS — E782 Mixed hyperlipidemia: Secondary | ICD-10-CM

## 2022-10-14 DIAGNOSIS — R0602 Shortness of breath: Secondary | ICD-10-CM

## 2022-10-14 NOTE — Progress Notes (Signed)
Cardiology Office Note   Date:  10/14/2022   ID:  Bill Barber, Bill Barber 27-Jul-1965, MRN [id number]  PCP:  [name], MD  Cardiologist:  [name], MD      History of Present Illness: Bill Barber is a 57 y.o. male who presents for  Chief Complaint  Patient presents with   Follow-up    Feeling fine      Past Medical History:  Diagnosis Date   Hyperlipidemia    Hypertension    Thyroid disease      History reviewed. No pertinent surgical history.   Current Outpatient Medications  Medication Sig Dispense Refill   amLODipine (NORVASC) 10 MG tablet Take 1 tablet (10 mg total) by mouth daily. 30 tablet 2   apixaban (ELIQUIS) 5 MG TABS tablet Take 1 tablet (5 mg total) by mouth 2 (two) times daily. 60 tablet 3   atorvastatin (LIPITOR) 40 MG tablet Take 40 mg by mouth daily.     Cholecalciferol (D 1000) 25 MCG (1000 UT) capsule Take 1,000 Units by mouth daily.     levothyroxine (SYNTHROID, LEVOTHROID) 150 MCG tablet Take 150 mcg by mouth daily before breakfast.     lisinopril (ZESTRIL) 40 MG tablet Take 40 mg by mouth daily.     sildenafil (VIAGRA) 100 MG tablet Take 100 mg by mouth daily as needed.     sotalol (BETAPACE) 80 MG tablet Take 1 tablet (80 mg total) by mouth 2 (two) times daily. 60 tablet 11   spironolactone (ALDACTONE) 25 MG tablet Take 1 tablet (25 mg total) by mouth daily. 30 tablet 11   Turmeric (QC TUMERIC COMPLEX) 500 MG CAPS Take by mouth.     Zinc Picolinate POWD Take by mouth.     No current facility-administered medications for this visit.    Allergies:   Patient has no known allergies.    Social History:   reports that he has never smoked. He has never used smokeless tobacco. He reports current alcohol use. He reports that he does not use drugs.   Family History:  family history is not on file.    ROS:     Review of Systems  Constitutional: Negative.   HENT: Negative.    Eyes: Negative.   Respiratory: Negative.     Gastrointestinal: Negative.   Genitourinary: Negative.   Musculoskeletal: Negative.   Skin: Negative.   Neurological: Negative.   Endo/Heme/Allergies: Negative.   Psychiatric/Behavioral: Negative.    All other systems reviewed and are negative.     All other systems are reviewed and negative.    PHYSICAL EXAM: VS:  BP 139/72   Pulse (!) 55   Ht 5\' 9"  (1.753 m)   Wt 240 lb 3.2 oz (109 kg)   SpO2 98%   BMI 35.47 kg/m  , BMI Body mass index is 35.47 kg/m. Last weight:  Wt Readings from Last 3 Encounters:  10/14/22 240 lb 3.2 oz (109 kg)  10/07/22 237 lb 3.2 oz (107.6 kg)  09/16/22 240 lb (108.9 kg)     Physical Exam Vitals reviewed.  Constitutional:      Appearance: Normal appearance. He is normal weight.  HENT:     Head: Normocephalic.     Nose: Nose normal.     Mouth/Throat:     Mouth: Mucous membranes are moist.  Eyes:     Pupils: Pupils are equal, round, and reactive to light.  Cardiovascular:     Rate and Rhythm: Normal rate and  regular rhythm.     Pulses: Normal pulses.     Heart sounds: Normal heart sounds.  Pulmonary:     Effort: Pulmonary effort is normal.  Abdominal:     General: Abdomen is flat. Bowel sounds are normal.  Musculoskeletal:        General: Normal range of motion.     Cervical back: Normal range of motion.  Skin:    General: Skin is warm.  Neurological:     General: No focal deficit present.     Mental Status: He is alert.  Psychiatric:        Mood and Affect: Mood normal.       EKG: Sinus bradycardia 55 bpm with QTc interval 461  Recent Labs: 09/14/2022: BUN 17; Creatinine, Ser 0.97; Potassium 4.8; Sodium 133    Lipid Panel No results found for: "CHOL", "TRIG", "HDL", "CHOLHDL", "VLDL", "LDLCALC", "LDLDIRECT"    Other studies Reviewed: Additional studies/ records that were reviewed today include:  Review of the above records demonstrates:       No data to display            ASSESSMENT AND PLAN:     ICD-10-CM   1. Nonrheumatic mitral valve regurgitation  I34.0     2. Atrial fibrillation, currently in sinus rhythm  Z86.79    Remains in sinus rhythm with sinus bradycardia 55 bpm on sotalol 80 twice daily QTc interval is 461    3. SOB (shortness of breath)  R06.02     4. Mixed hyperlipidemia  E78.2     5. Primary hypertension  I10    Repeat blood pressure was normal being 130/70.  Patient had hydrochlorothiazide stopped because of being on sotalol which is sensitive to hypomagnesemia.       Problem List Items Addressed This Visit       Cardiovascular and Mediastinum   Primary hypertension     Other   Mixed hyperlipidemia   Other Visit Diagnoses     Nonrheumatic mitral valve regurgitation    -  Primary   Atrial fibrillation, currently in sinus rhythm       Remains in sinus rhythm with sinus bradycardia 55 bpm on sotalol 80 twice daily QTc interval is 461   SOB (shortness of breath)              Disposition:   Return in about 3 months (around 01/14/2023).    Total time spent: 40 minutes  Signed,  [name], MD  10/14/2022 11:24 AM    [facility]

## 2022-11-09 ENCOUNTER — Other Ambulatory Visit: Payer: Self-pay | Admitting: Cardiovascular Disease

## 2022-11-09 ENCOUNTER — Telehealth: Payer: Self-pay | Admitting: Cardiovascular Disease

## 2022-11-09 MED ORDER — SPIRONOLACTONE 25 MG PO TABS: 25.0000 mg | ORAL_TABLET | Freq: Every day | ORAL | 11 refills | Status: AC

## 2022-11-09 NOTE — Telephone Encounter (Signed)
Patient left VM that over the weekend until now his blood pressure has been running high - has been in the 170s/90s. States SK took him off of hydrochlorothiazide so he has not been taking that. Would like a call back to let him know if we need to adjust his meds. Please advise.

## 2022-11-25 ENCOUNTER — Encounter: Payer: Self-pay | Admitting: Cardiovascular Disease

## 2022-11-25 ENCOUNTER — Ambulatory Visit: Payer: No Typology Code available for payment source | Admitting: Cardiovascular Disease

## 2022-11-25 VITALS — BP 150/85 | HR 55 | Ht 69.0 in | Wt 239.6 lb

## 2022-11-25 DIAGNOSIS — Z8679 Personal history of other diseases of the circulatory system: Secondary | ICD-10-CM

## 2022-11-25 DIAGNOSIS — R42 Dizziness and giddiness: Secondary | ICD-10-CM | POA: Diagnosis not present

## 2022-11-25 DIAGNOSIS — I340 Nonrheumatic mitral (valve) insufficiency: Secondary | ICD-10-CM

## 2022-11-25 DIAGNOSIS — I499 Cardiac arrhythmia, unspecified: Secondary | ICD-10-CM

## 2022-11-25 DIAGNOSIS — R0602 Shortness of breath: Secondary | ICD-10-CM

## 2022-11-25 DIAGNOSIS — I10 Essential (primary) hypertension: Secondary | ICD-10-CM

## 2022-11-25 DIAGNOSIS — E782 Mixed hyperlipidemia: Secondary | ICD-10-CM

## 2022-11-25 MED ORDER — LISINOPRIL 40 MG PO TABS: 40.0000 mg | ORAL_TABLET | Freq: Every day | ORAL | 2 refills | Status: DC

## 2022-11-25 MED ORDER — SPIRONOLACTONE 25 MG PO TABS
25.0000 mg | ORAL_TABLET | Freq: Every day | ORAL | 3 refills | Status: DC
Start: 2022-11-25 — End: 2023-10-06

## 2022-11-25 MED ORDER — HYDRALAZINE HCL 25 MG PO TABS
25.0000 mg | ORAL_TABLET | Freq: Four times a day (QID) | ORAL | 11 refills | Status: DC
Start: 2022-11-25 — End: 2023-10-06

## 2022-11-25 MED ORDER — AMLODIPINE BESYLATE 10 MG PO TABS: 10.0000 mg | ORAL_TABLET | Freq: Every day | ORAL | 2 refills | Status: DC

## 2022-11-25 MED ORDER — APIXABAN 5 MG PO TABS
5.0000 mg | ORAL_TABLET | Freq: Two times a day (BID) | ORAL | 2 refills | Status: DC
Start: 2022-11-25 — End: 2023-07-28

## 2022-11-25 MED ORDER — SOTALOL HCL 80 MG PO TABS
80.0000 mg | ORAL_TABLET | Freq: Two times a day (BID) | ORAL | 3 refills | Status: DC
Start: 2022-11-25 — End: 2024-01-05

## 2022-11-25 NOTE — Progress Notes (Signed)
Cardiology Office Note   Date:  11/25/2022   ID:  Bill Barber, DOB [birth year], MRN [id number]  PCP:  [name], MD  Cardiologist:  [name], MD      History of Present Illness: Bill Barber is a 57 y.o. male who presents for  Chief Complaint  Patient presents with   Follow-up    F/U    Feeling fine, as BP elevated      Past Medical History:  Diagnosis Date   Hyperlipidemia    Hypertension    Thyroid disease      No past surgical history on file.   Current Outpatient Medications  Medication Sig Dispense Refill   hydrALAZINE (APRESOLINE) 25 MG tablet Take 1 tablet (25 mg total) by mouth 4 (four) times daily. 120 tablet 11   amLODipine (NORVASC) 10 MG tablet Take 1 tablet (10 mg total) by mouth daily. 90 tablet 2   apixaban (ELIQUIS) 5 MG TABS tablet Take 1 tablet (5 mg total) by mouth 2 (two) times daily. 180 tablet 2   atorvastatin (LIPITOR) 40 MG tablet Take 40 mg by mouth daily.     Cholecalciferol (D 1000) 25 MCG (1000 UT) capsule Take 1,000 Units by mouth daily.     levothyroxine (SYNTHROID, LEVOTHROID) 150 MCG tablet Take 150 mcg by mouth daily before breakfast.     lisinopril (ZESTRIL) 40 MG tablet Take 1 tablet (40 mg total) by mouth daily. 90 tablet 2   sildenafil (VIAGRA) 100 MG tablet Take 100 mg by mouth daily as needed.     sotalol (BETAPACE) 80 MG tablet Take 1 tablet (80 mg total) by mouth 2 (two) times daily. 180 tablet 3   spironolactone (ALDACTONE) 25 MG tablet Take 1 tablet (25 mg total) by mouth daily. 30 tablet 11   spironolactone (ALDACTONE) 25 MG tablet Take 1 tablet (25 mg total) by mouth daily. 90 tablet 3   Turmeric (QC TUMERIC COMPLEX) 500 MG CAPS Take by mouth.     Zinc Picolinate POWD Take by mouth.     No current facility-administered medications for this visit.    Allergies:   Patient has no known allergies.    Social History:   reports that he has never smoked. He has never used smokeless tobacco. He  reports current alcohol use. He reports that he does not use drugs.   Family History:  family history is not on file.    ROS:     Review of Systems  Constitutional: Negative.   HENT: Negative.    Eyes: Negative.   Respiratory: Negative.    Gastrointestinal: Negative.   Genitourinary: Negative.   Musculoskeletal: Negative.   Skin: Negative.   Neurological: Negative.   Endo/Heme/Allergies: Negative.   Psychiatric/Behavioral: Negative.    All other systems reviewed and are negative.     All other systems are reviewed and negative.    PHYSICAL EXAM: VS:  BP (!) 150/85   Pulse (!) 55   Ht 5\' 9"  (1.753 m)   Wt 239 lb 9.6 oz (108.7 kg)   SpO2 97%   BMI 35.38 kg/m  , BMI Body mass index is 35.38 kg/m. Last weight:  Wt Readings from Last 3 Encounters:  11/25/22 239 lb 9.6 oz (108.7 kg)  10/14/22 240 lb 3.2 oz (109 kg)  10/07/22 237 lb 3.2 oz (107.6 kg)     Physical Exam Vitals reviewed.  Constitutional:      Appearance: Normal appearance. He is normal weight.  HENT:     Head: Normocephalic.     Nose: Nose normal.     Mouth/Throat:     Mouth: Mucous membranes are moist.  Eyes:     Pupils: Pupils are equal, round, and reactive to light.  Cardiovascular:     Rate and Rhythm: Normal rate and regular rhythm.     Pulses: Normal pulses.     Heart sounds: Normal heart sounds.  Pulmonary:     Effort: Pulmonary effort is normal.  Abdominal:     General: Abdomen is flat. Bowel sounds are normal.  Musculoskeletal:        General: Normal range of motion.     Cervical back: Normal range of motion.  Skin:    General: Skin is warm.  Neurological:     General: No focal deficit present.     Mental Status: He is alert.  Psychiatric:        Mood and Affect: Mood normal.       EKG:   Recent Labs: 09/14/2022: BUN 17; Creatinine, Ser 0.97; Potassium 4.8; Sodium 133    Lipid Panel No results found for: "CHOL", "TRIG", "HDL", "CHOLHDL", "VLDL", "LDLCALC", "LDLDIRECT"     Other studies Reviewed: Additional studies/ records that were reviewed today include:  Review of the above records demonstrates:       No data to display            ASSESSMENT AND PLAN:    ICD-10-CM   1. Primary hypertension  I10 hydrALAZINE (APRESOLINE) 25 MG tablet    apixaban (ELIQUIS) 5 MG TABS tablet    sotalol (BETAPACE) 80 MG tablet    spironolactone (ALDACTONE) 25 MG tablet   BP 150/70, will ahydralzine 25 bid    2. Mixed hyperlipidemia  E78.2 hydrALAZINE (APRESOLINE) 25 MG tablet    apixaban (ELIQUIS) 5 MG TABS tablet    sotalol (BETAPACE) 80 MG tablet    spironolactone (ALDACTONE) 25 MG tablet    3. Atrial fibrillation, currently in sinus rhythm  Z86.79 hydrALAZINE (APRESOLINE) 25 MG tablet    apixaban (ELIQUIS) 5 MG TABS tablet    sotalol (BETAPACE) 80 MG tablet    4. Dizziness  R42 hydrALAZINE (APRESOLINE) 25 MG tablet    apixaban (ELIQUIS) 5 MG TABS tablet    5. Nonrheumatic mitral valve regurgitation  I34.0 sotalol (BETAPACE) 80 MG tablet    spironolactone (ALDACTONE) 25 MG tablet    6. Atrial fibrillation, currently in sinus rhythm  Z86.79 hydrALAZINE (APRESOLINE) 25 MG tablet    apixaban (ELIQUIS) 5 MG TABS tablet    sotalol (BETAPACE) 80 MG tablet   Holter monitor confirmed patient has repeated episodes of atrial fibrillation.  Heart rate was as high as 170.  Will start the patient on sotalol 80 once a day.    7. SOB (shortness of breath)  R06.02 sotalol (BETAPACE) 80 MG tablet    spironolactone (ALDACTONE) 25 MG tablet    8. Primary hypertension  I10 hydrALAZINE (APRESOLINE) 25 MG tablet    apixaban (ELIQUIS) 5 MG TABS tablet    sotalol (BETAPACE) 80 MG tablet    spironolactone (ALDACTONE) 25 MG tablet   Will stop hydrochlorothiazide because it can lead to hypokalemia and hypomagnesemia in the setting of starting the patient on sotalol.    9. Cardiac arrhythmia, unspecified cardiac arrhythmia type  I49.9 spironolactone (ALDACTONE) 25 MG  tablet   Still wearing holter    10. Primary hypertension  I10 hydrALAZINE (APRESOLINE) 25 MG tablet  apixaban (ELIQUIS) 5 MG TABS tablet    sotalol (BETAPACE) 80 MG tablet    spironolactone (ALDACTONE) 25 MG tablet   add aldactone 25 as BP 180/90, may need hydralazine also but on f/u, check labs 1-2 week    11. SOB (shortness of breath)  R06.02 sotalol (BETAPACE) 80 MG tablet    spironolactone (ALDACTONE) 25 MG tablet   stress test negative       Problem List Items Addressed This Visit       Cardiovascular and Mediastinum   Primary hypertension - Primary   Relevant Medications   hydrALAZINE (APRESOLINE) 25 MG tablet   apixaban (ELIQUIS) 5 MG TABS tablet   lisinopril (ZESTRIL) 40 MG tablet   sotalol (BETAPACE) 80 MG tablet   spironolactone (ALDACTONE) 25 MG tablet   amLODipine (NORVASC) 10 MG tablet   Cardiac arrhythmia   Relevant Medications   hydrALAZINE (APRESOLINE) 25 MG tablet   apixaban (ELIQUIS) 5 MG TABS tablet   lisinopril (ZESTRIL) 40 MG tablet   sotalol (BETAPACE) 80 MG tablet   spironolactone (ALDACTONE) 25 MG tablet   amLODipine (NORVASC) 10 MG tablet     Other   Mixed hyperlipidemia   Relevant Medications   hydrALAZINE (APRESOLINE) 25 MG tablet   apixaban (ELIQUIS) 5 MG TABS tablet   lisinopril (ZESTRIL) 40 MG tablet   sotalol (BETAPACE) 80 MG tablet   spironolactone (ALDACTONE) 25 MG tablet   amLODipine (NORVASC) 10 MG tablet   Other Visit Diagnoses     Atrial fibrillation, currently in sinus rhythm       Relevant Medications   hydrALAZINE (APRESOLINE) 25 MG tablet   apixaban (ELIQUIS) 5 MG TABS tablet   sotalol (BETAPACE) 80 MG tablet   Dizziness       Relevant Medications   hydrALAZINE (APRESOLINE) 25 MG tablet   apixaban (ELIQUIS) 5 MG TABS tablet   Nonrheumatic mitral valve regurgitation       Relevant Medications   hydrALAZINE (APRESOLINE) 25 MG tablet   apixaban (ELIQUIS) 5 MG TABS tablet   lisinopril (ZESTRIL) 40 MG tablet    sotalol (BETAPACE) 80 MG tablet   spironolactone (ALDACTONE) 25 MG tablet   amLODipine (NORVASC) 10 MG tablet   Atrial fibrillation, currently in sinus rhythm       Holter monitor confirmed patient has repeated episodes of atrial fibrillation.  Heart rate was as high as 170.  Will start the patient on sotalol 80 once a day.   Relevant Medications   hydrALAZINE (APRESOLINE) 25 MG tablet   apixaban (ELIQUIS) 5 MG TABS tablet   sotalol (BETAPACE) 80 MG tablet   SOB (shortness of breath)       Relevant Medications   sotalol (BETAPACE) 80 MG tablet   spironolactone (ALDACTONE) 25 MG tablet   SOB (shortness of breath)       stress test negative   Relevant Medications   sotalol (BETAPACE) 80 MG tablet   spironolactone (ALDACTONE) 25 MG tablet          Disposition:   No follow-ups on file.    Total time spent: 30  minutes  Signed,  [name], MD  11/25/2022 3:23 PM    [facility]

## 2023-01-14 ENCOUNTER — Ambulatory Visit: Payer: No Typology Code available for payment source | Admitting: Cardiovascular Disease

## 2023-03-04 ENCOUNTER — Ambulatory Visit: Payer: Self-pay | Admitting: Cardiovascular Disease

## 2023-03-31 ENCOUNTER — Encounter: Payer: Self-pay | Admitting: Cardiovascular Disease

## 2023-03-31 ENCOUNTER — Ambulatory Visit: Payer: BC Managed Care – PPO | Admitting: Cardiovascular Disease

## 2023-03-31 VITALS — BP 128/78 | HR 62 | Ht 69.0 in | Wt 231.0 lb

## 2023-03-31 DIAGNOSIS — I10 Essential (primary) hypertension: Secondary | ICD-10-CM | POA: Diagnosis not present

## 2023-03-31 DIAGNOSIS — I340 Nonrheumatic mitral (valve) insufficiency: Secondary | ICD-10-CM | POA: Diagnosis not present

## 2023-03-31 DIAGNOSIS — E782 Mixed hyperlipidemia: Secondary | ICD-10-CM | POA: Diagnosis not present

## 2023-03-31 DIAGNOSIS — Z8679 Personal history of other diseases of the circulatory system: Secondary | ICD-10-CM

## 2023-03-31 DIAGNOSIS — R42 Dizziness and giddiness: Secondary | ICD-10-CM | POA: Diagnosis not present

## 2023-03-31 DIAGNOSIS — R0602 Shortness of breath: Secondary | ICD-10-CM

## 2023-03-31 DIAGNOSIS — I499 Cardiac arrhythmia, unspecified: Secondary | ICD-10-CM

## 2023-03-31 NOTE — Progress Notes (Addendum)
 Cardiology Office Note   Date:  03/31/2023   ID:  June Vacha, DOB 08/01/1965, MRN [id number]  PCP:  Rojelio Loader, MD  Cardiologist:  Denyse Bathe, MD      History of Present Illness: Bill Barber is a 58 y.o. male who presents for  Chief Complaint  Patient presents with   Follow-up    2 Months Follow up    Doing well      Past Medical History:  Diagnosis Date   Hyperlipidemia    Hypertension    Thyroid disease      History reviewed. No pertinent surgical history.   Current Outpatient Medications  Medication Sig Dispense Refill   amLODipine  (NORVASC ) 10 MG tablet Take 1 tablet (10 mg total) by mouth daily. 90 tablet 2   apixaban  (ELIQUIS ) 5 MG TABS tablet Take 1 tablet (5 mg total) by mouth 2 (two) times daily. 180 tablet 2   atorvastatin (LIPITOR) 40 MG tablet Take 40 mg by mouth daily.     Cholecalciferol (D 1000) 25 MCG (1000 UT) capsule Take 1,000 Units by mouth daily.     hydrALAZINE  (APRESOLINE ) 25 MG tablet Take 1 tablet (25 mg total) by mouth 4 (four) times daily. 120 tablet 11   levothyroxine (SYNTHROID, LEVOTHROID) 150 MCG tablet Take 150 mcg by mouth daily before breakfast.     lisinopril  (ZESTRIL ) 40 MG tablet Take 1 tablet (40 mg total) by mouth daily. 90 tablet 2   sildenafil  (VIAGRA ) 100 MG tablet Take 100 mg by mouth daily as needed.     sotalol  (BETAPACE ) 80 MG tablet Take 1 tablet (80 mg total) by mouth 2 (two) times daily. 180 tablet 3   spironolactone  (ALDACTONE ) 25 MG tablet Take 1 tablet (25 mg total) by mouth daily. 30 tablet 11   spironolactone  (ALDACTONE ) 25 MG tablet Take 1 tablet (25 mg total) by mouth daily. 90 tablet 3   Turmeric (QC TUMERIC COMPLEX) 500 MG CAPS Take by mouth.     Zinc Picolinate POWD Take by mouth.     No current facility-administered medications for this visit.    Allergies:   Patient has no known allergies.    Social History:   reports that he has never smoked. He has never used smokeless  tobacco. He reports current alcohol use. He reports that he does not use drugs.   Family History:  family history is not on file.    ROS:     Review of Systems  Constitutional: Negative.   HENT: Negative.    Eyes: Negative.   Respiratory: Negative.    Gastrointestinal: Negative.   Genitourinary: Negative.   Musculoskeletal: Negative.   Skin: Negative.   Neurological: Negative.   Endo/Heme/Allergies: Negative.   Psychiatric/Behavioral: Negative.    All other systems reviewed and are negative.     All other systems are reviewed and negative.    PHYSICAL EXAM: VS:  BP 128/78   Pulse 62   Ht 5' 9 (1.753 m)   Wt 231 lb (104.8 kg)   SpO2 96%   BMI 34.11 kg/m  , BMI Body mass index is 34.11 kg/m. Last weight:  Wt Readings from Last 3 Encounters:  03/31/23 231 lb (104.8 kg)  11/25/22 239 lb 9.6 oz (108.7 kg)  10/14/22 240 lb 3.2 oz (109 kg)     Physical Exam Vitals reviewed.  Constitutional:      Appearance: Normal appearance. He is normal weight.  HENT:     Head:  Normocephalic.     Nose: Nose normal.     Mouth/Throat:     Mouth: Mucous membranes are moist.  Eyes:     Pupils: Pupils are equal, round, and reactive to light.  Cardiovascular:     Rate and Rhythm: Normal rate and regular rhythm.     Pulses: Normal pulses.     Heart sounds: Normal heart sounds.  Pulmonary:     Effort: Pulmonary effort is normal.  Abdominal:     General: Abdomen is flat. Bowel sounds are normal.  Musculoskeletal:        General: Normal range of motion.     Cervical back: Normal range of motion.  Skin:    General: Skin is warm.  Neurological:     General: No focal deficit present.     Mental Status: He is alert.  Psychiatric:        Mood and Affect: Mood normal.       EKG:   Recent Labs: 09/14/2022: BUN 17; Creatinine, Ser 0.97; Potassium 4.8; Sodium 133    Lipid Panel No results found for: CHOL, TRIG, HDL, CHOLHDL, VLDL, LDLCALC, LDLDIRECT    Other  studies Reviewed: Additional studies/ records that were reviewed today include:  Review of the above records demonstrates:       No data to display            ASSESSMENT AND PLAN:    ICD-10-CM   1. Primary hypertension  I10     2. Mixed hyperlipidemia  E78.2     3. Atrial fibrillation, currently in sinus rhythm  Z86.79     4. Dizziness  R42     5. Nonrheumatic mitral valve regurgitation  I34.0     6. SOB (shortness of breath)  R06.02    No chest pain and SOB    7. Cardiac arrhythmia, unspecified cardiac arrhythmia type  I49.9        Problem List Items Addressed This Visit       Cardiovascular and Mediastinum   Primary hypertension - Primary   Cardiac arrhythmia     Other   Mixed hyperlipidemia   Other Visit Diagnoses       Atrial fibrillation, currently in sinus rhythm         Dizziness         Nonrheumatic mitral valve regurgitation         SOB (shortness of breath)       No chest pain and SOB          Disposition:   Return in about 3 months (around 06/28/2023).    Total time spent: 30 minutes  Signed,  Denyse Bathe, MD  03/31/2023 3:42 PM    [facility]

## 2023-06-18 ENCOUNTER — Encounter: Payer: Self-pay | Admitting: Cardiovascular Disease

## 2023-06-20 ENCOUNTER — Other Ambulatory Visit: Payer: Self-pay

## 2023-06-20 MED ORDER — SILDENAFIL CITRATE 100 MG PO TABS: 100.0000 mg | ORAL_TABLET | Freq: Every day | ORAL | 3 refills | Status: AC | PRN

## 2023-07-26 ENCOUNTER — Other Ambulatory Visit: Payer: Self-pay | Admitting: Cardiovascular Disease

## 2023-07-26 DIAGNOSIS — I10 Essential (primary) hypertension: Secondary | ICD-10-CM

## 2023-07-26 DIAGNOSIS — R42 Dizziness and giddiness: Secondary | ICD-10-CM

## 2023-07-26 DIAGNOSIS — E782 Mixed hyperlipidemia: Secondary | ICD-10-CM

## 2023-07-26 DIAGNOSIS — Z8679 Personal history of other diseases of the circulatory system: Secondary | ICD-10-CM

## 2023-08-12 ENCOUNTER — Ambulatory Visit: Payer: BC Managed Care – PPO | Admitting: Cardiovascular Disease

## 2023-08-12 ENCOUNTER — Encounter: Payer: Self-pay | Admitting: Cardiovascular Disease

## 2023-08-12 VITALS — BP 140/80 | HR 67 | Ht 69.0 in | Wt 232.6 lb

## 2023-08-12 DIAGNOSIS — I10 Essential (primary) hypertension: Secondary | ICD-10-CM | POA: Diagnosis not present

## 2023-08-12 DIAGNOSIS — R0602 Shortness of breath: Secondary | ICD-10-CM | POA: Diagnosis not present

## 2023-08-12 DIAGNOSIS — I340 Nonrheumatic mitral (valve) insufficiency: Secondary | ICD-10-CM

## 2023-08-12 DIAGNOSIS — E782 Mixed hyperlipidemia: Secondary | ICD-10-CM

## 2023-08-12 DIAGNOSIS — Z8679 Personal history of other diseases of the circulatory system: Secondary | ICD-10-CM

## 2023-08-12 NOTE — Progress Notes (Signed)
 Cardiology Office Note   Date:  08/12/2023   ID:  Bill, Barber [birth year], MRN [id number]  PCP:  Lonell Rives, MD  Cardiologist:  Debborah Fairly, MD      History of Present Illness: Bill Barber is a 58 y.o. male who presents for  Chief Complaint  Patient presents with   Follow-up    4 month    Doing well      Past Medical History:  Diagnosis Date   Hyperlipidemia    Hypertension    Thyroid disease      History reviewed. No pertinent surgical history.   Current Outpatient Medications  Medication Sig Dispense Refill   amLODipine  (NORVASC ) 10 MG tablet Take 1 tablet (10 mg total) by mouth daily. 90 tablet 2   atorvastatin (LIPITOR) 40 MG tablet Take 40 mg by mouth daily.     Cholecalciferol (D 1000) 25 MCG (1000 UT) capsule Take 1,000 Units by mouth daily.     ELIQUIS  5 MG TABS tablet TAKE 1 TABLET BY MOUTH TWICE  DAILY 180 tablet 3   hydrALAZINE  (APRESOLINE ) 25 MG tablet Take 1 tablet (25 mg total) by mouth 4 (four) times daily. 120 tablet 11   levothyroxine (SYNTHROID, LEVOTHROID) 150 MCG tablet Take 150 mcg by mouth daily before breakfast.     lisinopril  (ZESTRIL ) 40 MG tablet Take 1 tablet (40 mg total) by mouth daily. 90 tablet 2   sildenafil  (VIAGRA ) 100 MG tablet Take 1 tablet (100 mg total) by mouth daily as needed. 10 tablet 3   sotalol  (BETAPACE ) 80 MG tablet Take 1 tablet (80 mg total) by mouth 2 (two) times daily. 180 tablet 3   spironolactone  (ALDACTONE ) 25 MG tablet Take 1 tablet (25 mg total) by mouth daily. 30 tablet 11   spironolactone  (ALDACTONE ) 25 MG tablet Take 1 tablet (25 mg total) by mouth daily. 90 tablet 3   Turmeric (QC TUMERIC COMPLEX) 500 MG CAPS Take by mouth.     Zinc Picolinate POWD Take by mouth.     No current facility-administered medications for this visit.    Allergies:   Patient has no known allergies.    Social History:   reports that he has never smoked. He has never used smokeless tobacco. He reports  current alcohol use. He reports that he does not use drugs.   Family History:  family history is not on file.    ROS:     Review of Systems  Constitutional: Negative.   HENT: Negative.    Eyes: Negative.   Respiratory: Negative.    Gastrointestinal: Negative.   Genitourinary: Negative.   Musculoskeletal: Negative.   Skin: Negative.   Neurological: Negative.   Endo/Heme/Allergies: Negative.   Psychiatric/Behavioral: Negative.    All other systems reviewed and are negative.     All other systems are reviewed and negative.    PHYSICAL EXAM: VS:  BP (!) 140/80   Pulse 67   Ht 5' 9 (1.753 m)   Wt 232 lb 9.6 oz (105.5 kg)   SpO2 96%   BMI 34.35 kg/m  , BMI Body mass index is 34.35 kg/m. Last weight:  Wt Readings from Last 3 Encounters:  08/12/23 232 lb 9.6 oz (105.5 kg)  03/31/23 231 lb (104.8 kg)  11/25/22 239 lb 9.6 oz (108.7 kg)     Physical Exam Vitals reviewed.  Constitutional:      Appearance: Normal appearance. He is normal weight.  HENT:     Head:  Normocephalic.     Nose: Nose normal.     Mouth/Throat:     Mouth: Mucous membranes are moist.   Eyes:     Pupils: Pupils are equal, round, and reactive to light.    Cardiovascular:     Rate and Rhythm: Normal rate and regular rhythm.     Pulses: Normal pulses.     Heart sounds: Normal heart sounds.  Pulmonary:     Effort: Pulmonary effort is normal.  Abdominal:     General: Abdomen is flat. Bowel sounds are normal.   Musculoskeletal:        General: Normal range of motion.     Cervical back: Normal range of motion.   Skin:    General: Skin is warm.   Neurological:     General: No focal deficit present.     Mental Status: He is alert.   Psychiatric:        Mood and Affect: Mood normal.       EKG:   Recent Labs: 09/14/2022: BUN 17; Creatinine, Ser 0.97; Potassium 4.8; Sodium 133    Lipid Panel No results found for: CHOL, TRIG, HDL, CHOLHDL, VLDL, LDLCALC, LDLDIRECT     Other studies Reviewed: Additional studies/ records that were reviewed today include:  Review of the above records demonstrates:       No data to display            ASSESSMENT AND PLAN:    ICD-10-CM   1. Atrial fibrillation, currently in sinus rhythm  Z86.79     2. Mixed hyperlipidemia  E78.2     3. Primary hypertension  I10    change hydralzine 2 tab bid    4. Nonrheumatic mitral valve regurgitation  I34.0     5. SOB (shortness of breath)  R06.02        Problem List Items Addressed This Visit       Cardiovascular and Mediastinum   Primary hypertension     Other   Mixed hyperlipidemia   Other Visit Diagnoses       Atrial fibrillation, currently in sinus rhythm    -  Primary     Nonrheumatic mitral valve regurgitation         SOB (shortness of breath)              Disposition:   Return in about 3 months (around 11/12/2023).    Total time spent: 30 minutes  Signed,  Debborah Fairly, MD  08/12/2023 3:46 PM    [facility]

## 2023-10-05 ENCOUNTER — Other Ambulatory Visit: Payer: Self-pay | Admitting: Cardiovascular Disease

## 2023-10-05 DIAGNOSIS — E782 Mixed hyperlipidemia: Secondary | ICD-10-CM

## 2023-10-05 DIAGNOSIS — I10 Essential (primary) hypertension: Secondary | ICD-10-CM

## 2023-10-05 DIAGNOSIS — I499 Cardiac arrhythmia, unspecified: Secondary | ICD-10-CM

## 2023-10-05 DIAGNOSIS — R0602 Shortness of breath: Secondary | ICD-10-CM

## 2023-10-05 DIAGNOSIS — Z8679 Personal history of other diseases of the circulatory system: Secondary | ICD-10-CM

## 2023-10-05 DIAGNOSIS — R42 Dizziness and giddiness: Secondary | ICD-10-CM

## 2023-10-05 DIAGNOSIS — I340 Nonrheumatic mitral (valve) insufficiency: Secondary | ICD-10-CM

## 2023-11-11 ENCOUNTER — Ambulatory Visit: Admitting: Cardiovascular Disease

## 2023-11-11 ENCOUNTER — Encounter: Payer: Self-pay | Admitting: Cardiovascular Disease

## 2023-11-11 VITALS — BP 132/80 | HR 70 | Ht 69.0 in | Wt 238.0 lb

## 2023-11-11 DIAGNOSIS — E782 Mixed hyperlipidemia: Secondary | ICD-10-CM

## 2023-11-11 DIAGNOSIS — R42 Dizziness and giddiness: Secondary | ICD-10-CM

## 2023-11-11 DIAGNOSIS — R0602 Shortness of breath: Secondary | ICD-10-CM

## 2023-11-11 DIAGNOSIS — I499 Cardiac arrhythmia, unspecified: Secondary | ICD-10-CM

## 2023-11-11 DIAGNOSIS — I10 Essential (primary) hypertension: Secondary | ICD-10-CM

## 2023-11-11 DIAGNOSIS — I340 Nonrheumatic mitral (valve) insufficiency: Secondary | ICD-10-CM

## 2023-11-11 DIAGNOSIS — I480 Paroxysmal atrial fibrillation: Secondary | ICD-10-CM

## 2023-11-11 DIAGNOSIS — Z8679 Personal history of other diseases of the circulatory system: Secondary | ICD-10-CM

## 2023-11-11 NOTE — Progress Notes (Signed)
 Cardiology Office Note   Date:  11/11/2023   ID:  Bill Barber, DOB [birth year], MRN [id number]  PCP:  Rojelio Loader, MD  Cardiologist:  Denyse Bathe, MD      History of Present Illness: Bill Barber is a 58 y.o. male who presents for  Chief Complaint  Patient presents with   Follow-up    3 month follow up    Doing well.      Past Medical History:  Diagnosis Date   Hyperlipidemia    Hypertension    Thyroid disease      History reviewed. No pertinent surgical history.   Current Outpatient Medications  Medication Sig Dispense Refill   amLODipine  (NORVASC ) 10 MG tablet TAKE 1 TABLET BY MOUTH DAILY 90 tablet 3   atorvastatin (LIPITOR) 40 MG tablet Take 40 mg by mouth daily.     Cholecalciferol (D 1000) 25 MCG (1000 UT) capsule Take 1,000 Units by mouth daily.     ELIQUIS  5 MG TABS tablet TAKE 1 TABLET BY MOUTH TWICE  DAILY 180 tablet 3   hydrALAZINE  (APRESOLINE ) 25 MG tablet TAKE 1 TABLET BY MOUTH 4 TIMES  DAILY 360 tablet 3   levothyroxine (SYNTHROID, LEVOTHROID) 150 MCG tablet Take 150 mcg by mouth daily before breakfast.     lisinopril  (ZESTRIL ) 40 MG tablet TAKE 1 TABLET BY MOUTH DAILY 90 tablet 3   sildenafil  (VIAGRA ) 100 MG tablet Take 1 tablet (100 mg total) by mouth daily as needed. 10 tablet 3   sotalol  (BETAPACE ) 80 MG tablet Take 1 tablet (80 mg total) by mouth 2 (two) times daily. 180 tablet 3   spironolactone  (ALDACTONE ) 25 MG tablet Take 1 tablet (25 mg total) by mouth daily. 30 tablet 11   spironolactone  (ALDACTONE ) 25 MG tablet TAKE 1 TABLET BY MOUTH DAILY 90 tablet 3   Turmeric (QC TUMERIC COMPLEX) 500 MG CAPS Take by mouth.     Zinc Picolinate POWD Take by mouth.     No current facility-administered medications for this visit.    Allergies:   Patient has no known allergies.    Social History:   reports that he has never smoked. He has never used smokeless tobacco. He reports current alcohol use. He reports that he does not use  drugs.   Family History:  family history is not on file.    ROS:     Review of Systems  Constitutional: Negative.   HENT: Negative.    Eyes: Negative.   Respiratory: Negative.    Gastrointestinal: Negative.   Genitourinary: Negative.   Musculoskeletal: Negative.   Skin: Negative.   Neurological: Negative.   Endo/Heme/Allergies: Negative.   Psychiatric/Behavioral: Negative.    All other systems reviewed and are negative.     All other systems are reviewed and negative.    PHYSICAL EXAM: VS:  BP 132/80   Pulse 70   Ht 5' 9 (1.753 m)   Wt 238 lb (108 kg)   SpO2 94%   BMI 35.15 kg/m  , BMI Body mass index is 35.15 kg/m. Last weight:  Wt Readings from Last 3 Encounters:  11/11/23 238 lb (108 kg)  08/12/23 232 lb 9.6 oz (105.5 kg)  03/31/23 231 lb (104.8 kg)     Physical Exam Vitals reviewed.  Constitutional:      Appearance: Normal appearance. He is normal weight.  HENT:     Head: Normocephalic.     Nose: Nose normal.     Mouth/Throat:  Mouth: Mucous membranes are moist.  Eyes:     Pupils: Pupils are equal, round, and reactive to light.  Cardiovascular:     Rate and Rhythm: Normal rate and regular rhythm.     Pulses: Normal pulses.     Heart sounds: Normal heart sounds.  Pulmonary:     Effort: Pulmonary effort is normal.  Abdominal:     General: Abdomen is flat. Bowel sounds are normal.  Musculoskeletal:        General: Normal range of motion.     Cervical back: Normal range of motion.  Skin:    General: Skin is warm.  Neurological:     General: No focal deficit present.     Mental Status: He is alert.  Psychiatric:        Mood and Affect: Mood normal.       EKG:   Recent Labs: No results found for requested labs within last 365 days.    Lipid Panel No results found for: CHOL, TRIG, HDL, CHOLHDL, VLDL, LDLCALC, LDLDIRECT    Other studies Reviewed: Additional studies/ records that were reviewed today include:  Review  of the above records demonstrates:       No data to display            ASSESSMENT AND PLAN:    ICD-10-CM   1. Cardiac arrhythmia, unspecified cardiac arrhythmia type  I49.9 PCV ECHOCARDIOGRAM COMPLETE    2. Primary hypertension  I10 PCV ECHOCARDIOGRAM COMPLETE   Low salt diet.    3. Mixed hyperlipidemia  E78.2 PCV ECHOCARDIOGRAM COMPLETE    4. SOB (shortness of breath)  R06.02 PCV ECHOCARDIOGRAM COMPLETE    5. Paroxysmal atrial fibrillation (HCC)  I48.0 PCV ECHOCARDIOGRAM COMPLETE    6. Nonrheumatic mitral valve regurgitation  I34.0 PCV ECHOCARDIOGRAM COMPLETE    7. Dizziness  R42 PCV ECHOCARDIOGRAM COMPLETE    8. Atrial fibrillation, currently in sinus rhythm  Z86.79 PCV ECHOCARDIOGRAM COMPLETE       Problem List Items Addressed This Visit       Cardiovascular and Mediastinum   Primary hypertension   Relevant Orders   PCV ECHOCARDIOGRAM COMPLETE   Cardiac arrhythmia - Primary   Relevant Orders   PCV ECHOCARDIOGRAM COMPLETE   PCV ECHOCARDIOGRAM COMPLETE     Other   Mixed hyperlipidemia   Relevant Orders   PCV ECHOCARDIOGRAM COMPLETE   Other Visit Diagnoses       SOB (shortness of breath)       Relevant Orders   PCV ECHOCARDIOGRAM COMPLETE     Nonrheumatic mitral valve regurgitation       Relevant Orders   PCV ECHOCARDIOGRAM COMPLETE     Dizziness       Relevant Orders   PCV ECHOCARDIOGRAM COMPLETE     Atrial fibrillation, currently in sinus rhythm       Relevant Orders   PCV ECHOCARDIOGRAM COMPLETE          Disposition:   Return in about 3 months (around 02/10/2024) for echo and f/u.    Total time spent: 30 minutes  Signed,  Denyse Bathe, MD  11/11/2023 3:40 PM    [facility]

## 2024-01-03 ENCOUNTER — Other Ambulatory Visit: Payer: Self-pay | Admitting: Cardiovascular Disease

## 2024-01-03 DIAGNOSIS — I340 Nonrheumatic mitral (valve) insufficiency: Secondary | ICD-10-CM

## 2024-01-03 DIAGNOSIS — E782 Mixed hyperlipidemia: Secondary | ICD-10-CM

## 2024-01-03 DIAGNOSIS — I10 Essential (primary) hypertension: Secondary | ICD-10-CM

## 2024-01-03 DIAGNOSIS — R0602 Shortness of breath: Secondary | ICD-10-CM

## 2024-01-03 DIAGNOSIS — Z8679 Personal history of other diseases of the circulatory system: Secondary | ICD-10-CM

## 2024-02-07 ENCOUNTER — Other Ambulatory Visit

## 2024-02-10 ENCOUNTER — Ambulatory Visit: Admitting: Cardiovascular Disease

## 2024-02-13 ENCOUNTER — Other Ambulatory Visit

## 2024-02-14 ENCOUNTER — Ambulatory Visit: Admitting: Cardiovascular Disease

## 2024-02-17 ENCOUNTER — Ambulatory Visit

## 2024-02-17 DIAGNOSIS — R42 Dizziness and giddiness: Secondary | ICD-10-CM

## 2024-02-17 DIAGNOSIS — I480 Paroxysmal atrial fibrillation: Secondary | ICD-10-CM

## 2024-02-17 DIAGNOSIS — I351 Nonrheumatic aortic (valve) insufficiency: Secondary | ICD-10-CM

## 2024-02-17 DIAGNOSIS — I10 Essential (primary) hypertension: Secondary | ICD-10-CM

## 2024-02-17 DIAGNOSIS — Z8679 Personal history of other diseases of the circulatory system: Secondary | ICD-10-CM

## 2024-02-17 DIAGNOSIS — R0602 Shortness of breath: Secondary | ICD-10-CM

## 2024-02-17 DIAGNOSIS — E782 Mixed hyperlipidemia: Secondary | ICD-10-CM

## 2024-02-17 DIAGNOSIS — I340 Nonrheumatic mitral (valve) insufficiency: Secondary | ICD-10-CM

## 2024-02-17 DIAGNOSIS — I499 Cardiac arrhythmia, unspecified: Secondary | ICD-10-CM
